# Patient Record
Sex: Male | Born: 2005 | Race: Black or African American | Hispanic: No | Marital: Single | Smoking: Never smoker
Health system: Southern US, Community
[De-identification: ages and names within clinical notes are randomized; demographics above are authoritative.]

## PROBLEM LIST (undated history)

## (undated) HISTORY — PX: FEMUR FRACTURE SURGERY: SHX633

---
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
## 2024-02-20 ENCOUNTER — Ambulatory Visit: Admitting: Pediatrics

## 2024-02-20 DIAGNOSIS — R82998 Other abnormal findings in urine: Secondary | ICD-10-CM | POA: Diagnosis not present

## 2024-02-20 DIAGNOSIS — R079 Chest pain, unspecified: Secondary | ICD-10-CM | POA: Diagnosis not present

## 2024-02-20 DIAGNOSIS — G4489 Other headache syndrome: Secondary | ICD-10-CM | POA: Diagnosis not present

## 2024-02-20 DIAGNOSIS — Z20822 Contact with and (suspected) exposure to covid-19: Secondary | ICD-10-CM | POA: Diagnosis not present

## 2024-02-20 NOTE — Progress Notes (Signed)
 Subjective: Patient ID:  Bradley Andrews is a 18 y.o. male  Chief Complaint  Patient presents with  . Chest Pain    Started Tuesday, sharp pain, does not radiate, denies N/V, shortness of breath  . Headache    Pt c/o headache, body aches. No fever. Symptoms started Tuesday. Pt has been taking Ibuprofen    The following information was reviewed by members of the visit team:  Tobacco  Allergies  Meds  Problems  Med Hx  Surg Hx  OB Status   Fam Hx    18 year old male presents for evaluation of symptoms that started suddenly Tuesday, 2 days ago when he was headed to work.  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  He denies any fall, injury or new activity prior to symptoms starting.  He also denies any new medications, supplements or increased caffeine intake.   He complains of chest pain that is sharp, along with low back pain, what he describes as body aches and a frontal headache.  He denies any fever, chills, nausea, vomiting or change in bowel/bladder habits. He reports he has been eating and drinking normally, and denies any sick contacts or recent travel.   He denies any medical history, and does not appear to be in distress during exam.   Chest Pain  Associated symptoms include back pain and headaches. Pertinent negatives include no abdominal pain, dizziness, fever, nausea, shortness of breath, vomiting or weakness.  Headache  Associated symptoms include back pain. Pertinent negatives include no abdominal pain, dizziness, fever, nausea, vomiting or weakness.     Review of Systems  Constitutional:  Negative for chills and fever.  Respiratory:  Negative for shortness of breath.   Cardiovascular:  Positive for chest pain.  Gastrointestinal:  Negative for abdominal pain, diarrhea, nausea and vomiting.  Genitourinary:  Negative for dysuria.  Musculoskeletal:  Positive for back pain.  Neurological:  Positive for headaches. Negative for dizziness and  weakness.      Objective  Vitals:   02/20/24 1048 02/20/24 1054  BP: 144/83 (!) 130/94  Pulse: 72   Resp: 18   Temp: 97.5 F (36.4 C)   TempSrc: Tympanic   SpO2: 100%   Weight: 96.6 kg (213 lb)   Height: 1.727 m (5' 8)     No LMP for male patient.   Behavioral Health Screening  Patient Health Questionnaire-2 Score: 0 (02/20/2024 10:47 AM)      Patient's Depression screening is Negative   Depression Plan: Normal/Negative Screening  Physical Exam Vitals and nursing note reviewed.  Constitutional:      Appearance: Normal appearance. He is normal weight.  HENT:     Mouth/Throat:     Mouth: Mucous membranes are moist.   Eyes:     Extraocular Movements: Extraocular movements intact.     Conjunctiva/sclera: Conjunctivae normal.     Pupils: Pupils are equal, round, and reactive to light.    Cardiovascular:     Rate and Rhythm: Normal rate and regular rhythm.     Heart sounds: Normal heart sounds.  Pulmonary:     Effort: Pulmonary effort is normal.     Breath sounds: Normal breath sounds.  Chest:     Chest wall: Tenderness present.   Skin:    General: Skin is warm and dry.     Capillary Refill: Capillary refill takes less than 2 seconds.   Neurological:     General: No focal deficit present.  Mental Status: He is alert and oriented to person, place, and time.   Psychiatric:        Mood and Affect: Mood normal.        Behavior: Behavior normal.      Recent Results (from the past 24 hours)  POC Urinalysis Auto without Microscopic   Collection Time: 02/20/24 11:07 AM  Result Value Ref Range   Color, Urine Yellow Yellow   Clarity, Urine Clear Clear   Glucose, Urine Negative Negative mg/dL   Bilirubin, Urine Negative Negative   Ketones, Urine Negative Negative mg/dL   Specific Gravity, Urine 1.020 1.010, 1.015, 1.020, 1.025   Blood, Urine Negative Negative   pH, Urine 6.5 5.0, 5.5, 6.0, 6.5, 7.0, 7.5, 8.0   Protein, Urine Negative Negative mg/dL    Urobilinogen, Urine 0.2 <2.0 mg/dL   Nitrite, Urine Negative Negative   Leukocyte Esterase, Urine Small (A) Negative   Kit/Device Lot # [id number]    Kit/Device Expiration Date 01/19/2025   POC SARS-COV-2 SYMPTOMATIC (IDNOW)   Collection Time: 02/20/24 11:08 AM  Result Value Ref Range   SARS-COV-2 IDNOW Negative Negative   SARS IDNOW Information      A rapid, molecular diagnostic test on the IDNOW.  Negative results should be treated as presumptive and, if inconsistent with clinical symptoms should be confirmed with an alternative molecular assay.     Radiologist interpretation:   XR Chest 1 View  Final Result by Eduard Vikki Guppy, MD (07/31 1203)  XR CHEST 1 VIEW, 02/20/2024 11:23 AM    INDICATION:Chest pain, unspecified \ R07.9 Chest pain, unspecified   COMPARISON: Chest radiograph 12/19/2022.    FINDINGS:     Supportive devices: None  Cardiovascular/lungs/pleura: Cardiac silhouette and pulmonary vasculature   are within normal limits. Lungs are clear. No pleural effusion or   pneumothorax.   Other: Unremarkable.    IMPRESSION:  There is no evidence of acute cardiac or pulmonary abnormality.         EKG: normal sinus rhythm sinus arrhythmia, 68BPM, nonspecific t wave abnormality, similar to past, no acute changes.   Assessment/Plan   Jaynie Shiley was seen today for chest pain and headache.  Diagnoses and all orders for this visit:  Chest pain, unspecified type -     ECG 12 lead -     POC Urinalysis Auto without Microscopic -     POC SARS-COV-2 SYMPTOMATIC (IDNOW) -     CBC with Differential -     Comprehensive Metabolic Panel -     Magnesium -     CK, Total -     Troponin, High Sensitive -     Cancel: XR Chest 2 Views -     XR Chest 1 View  Other headache syndrome -     POC Urinalysis Auto without Microscopic -     POC SARS-COV-2 SYMPTOMATIC (IDNOW) -     CBC with Differential -     Comprehensive Metabolic Panel -     Magnesium -     CK, Total -      ondansetron (ZOFRAN-ODT) 4 mg disintegrating tablet; Dissolve 1 tablet (4 mg total) on tongue every 8 (eight) hours as needed for nausea or vomiting. -     ketorolac  (TORADOL ) injection 15 mg -     dexAMETHasone  (DECADRON ) injection 10 mg  Leukocytes in urine -     Urine Culture    Patient has been instructed on RX/ OTC medications, dosages, side effects, and possible interactions as  associated with each diagnosis in my impression and plan above.  2.   Patient education (verbal/handout) given on diagnosis, pathophysiology, treatment of diagnosis, side effects of medication use for treatment, restrictions while taking medication.  Supportive       Care measures as directed on AVS.  Red Flags associated with diagnosis/es were reviewed and patient instructed on action plan if red flags develop.  3.   Urgent Care Disposition:  Follow up with PCP       They have been instructed that if symptoms worse that should go to Urgent Care, go to the nearest ED, or activate EMS.  4.   Patient agreed with plan and voiced understanding.  NO barriers to adherence perceived by myself.  Electronically signed: Rosaline Jama Collet FNP  Thu 02/20/2024 12:36 PM

## 2024-02-22 DIAGNOSIS — I498 Other specified cardiac arrhythmias: Secondary | ICD-10-CM | POA: Diagnosis not present

## 2024-03-13 ENCOUNTER — Ambulatory Visit

## 2024-03-13 ENCOUNTER — Other Ambulatory Visit (HOSPITAL_COMMUNITY)
Admission: RE | Admit: 2024-03-13 | Discharge: 2024-03-13 | Disposition: A | Discharge status: Home / Self Care | Source: Ambulatory Visit | Attending: Pediatrics | Admitting: Pediatrics

## 2024-03-13 VITALS — BP 124/84 | Ht 68.7 in | Wt 211.1 lb

## 2024-03-13 DIAGNOSIS — Z68.41 Body mass index (BMI) pediatric, greater than or equal to 95th percentile for age: Secondary | ICD-10-CM | POA: Diagnosis not present

## 2024-03-13 DIAGNOSIS — Z113 Encounter for screening for infections with a predominantly sexual mode of transmission: Secondary | ICD-10-CM | POA: Insufficient documentation

## 2024-03-13 DIAGNOSIS — E669 Obesity, unspecified: Secondary | ICD-10-CM

## 2024-03-13 DIAGNOSIS — Z0000 Encounter for general adult medical examination without abnormal findings: Secondary | ICD-10-CM

## 2024-03-13 DIAGNOSIS — Z1331 Encounter for screening for depression: Secondary | ICD-10-CM

## 2024-03-13 DIAGNOSIS — Z1339 Encounter for screening examination for other mental health and behavioral disorders: Secondary | ICD-10-CM | POA: Diagnosis not present

## 2024-03-13 DIAGNOSIS — R040 Epistaxis: Secondary | ICD-10-CM | POA: Diagnosis not present

## 2024-03-13 DIAGNOSIS — Z0001 Encounter for general adult medical examination with abnormal findings: Secondary | ICD-10-CM

## 2024-03-13 DIAGNOSIS — Z114 Encounter for screening for human immunodeficiency virus [HIV]: Secondary | ICD-10-CM

## 2024-03-13 LAB — POCT RAPID HIV: Rapid HIV, POC: NEGATIVE

## 2024-03-13 NOTE — Progress Notes (Signed)
 Adolescent Well Care Visit Bradley Andrews is a 18 y.o. male who is here for well care.     PCP:  Taft Jon PARAS, MD   History was provided by the patient.  Girlfriend present for visit.  Confidentiality was discussed with the patient and, if applicable, with caregiver as well. Patient's personal or confidential phone number: [phone]  Interval history: -Last well-visit 04/12/2022 (18 yo) -discussed increased BMI -04/12/22 - pulm appt, mild intermittent asthma, continue albuterol  PRN -02/20/24 - seen for chest pain and headache, tx zofran, toradol , decadron   Current Issues: Current concerns include:   Epistaxis -frequent nosebleeds -has been ongoing since a kid -worse over the past month -used to use a humidifier but not anymore -sometimes caused by stress -last occurred last weekend, lasted 45 minutes, wasn't applying pressure -last one prior to that was one month ago and was not as long -L nostril more often, but sometimes R nostril -no family history of bleeding disorders -uses NSAIDs for headaches, but only uses them once every 2-3 months -no easy bruising or other cutaneous bleeding -other bleeding such as paper cut will heal pretty quickly -no blood in stool  Nutrition: Nutrition/Eating Behaviors: eats a variety, eats 3 meals per day, likes fruits and vegetables Adequate calcium in diet?: milk, cheese, yogurt Supplements/ Vitamins: none  Exercise/ Media: Play any Sports?:  none Exercise:  active at his job  Sleep:  Sleep: 5-8 hours (sometimes 5 due to work schedule, see below).  No problems falling or staying asleep.  Social Screening: Lives with: mom Parental relations:  good Activities, Work, and Regulatory affairs officer?: work (see below) Concerns regarding behavior with peers?  no Stressors of note: grandma losing her memory  Education: School Name: graduated from [employer] in 2024 Currently works at [employer] in [place]. Works in Pharmacist, hospital. Works 7 pm-7am for 4-5 days.  Patient has a dental home: yes, no concerns   Confidential social history: Tobacco?  no Secondhand smoke exposure?  Mother vapes inside the house. Drugs/ETOH?  Yes, smokes marijuana (once every few weekends)  Sexually Active?  yes   Pregnancy Prevention: uses condoms every time, no history of STI's  Safe at home, in school & in relationships?  Yes Safe to self?  Yes   Screenings:  The patient completed the Rapid Assessment for Adolescent Preventive Services screening questionnaire and the following topics were identified as risk factors and discussed: marijuana use and condom use.  PHQ-9 completed and results indicated no concerns. Flowsheet Row Office Visit from 03/13/2024 in [place] [facility]  PHQ-9 Total Score 1    Physical Exam:  Vitals:   03/13/24 1349  BP: 124/84  Weight: 211 lb 1.6 oz (95.8 kg)  Height: 5' 8.7 (1.745 m)   BP 124/84   Ht 5' 8.7 (1.745 m)   Wt 211 lb 1.6 oz (95.8 kg)   BMI 31.45 kg/m  Body mass index: body mass index is 31.45 kg/m. Blood pressure %iles are not available for patients who are 18 years or older.  Hearing Screening   500Hz  1000Hz  2000Hz  4000Hz   Right ear 20 20 20 20   Left ear 20 20 20 20    Vision Screening   Right eye Left eye Both eyes  Without correction 20/16 20/20 20/16   With correction       General: Awake, alert, appropriately responsive in no acute distress. HEENT: EOMI, PERRL, clear sclera and conjunctiva, corneal light reflex symmetric. TM's clear  bilaterally, non-bulging. Clear nares bilaterally. Oropharynx clear with no tonsillar enlargment or exudates. Moist mucous membranes. Neck: Supple. Lymph Nodes: No palpable lymphadenopathy. CV: RRR, normal S1, S2. No murmur appreciated. 2+ distal pulses.  Pulm: Normal WOB. CTAB with good aeration throughout.  No focal wheezing/crackles. Abd: Normoactive bowel sounds. Soft, non-tender,  non-distended. MSK: Extremities WWP. Moves all extremities equally.  Neuro: Appropriately responsive to stimuli. Normal bulk and tone. No gross deficits appreciated. Skin: No rashes or lesions appreciated. Cap refill < 2 seconds.    Assessment and Plan:   1. 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  Counseled on healthy lifestyle choices with nutrition and physical activity. - Lipid panel  3. Screening for human immunodeficiency virus Results negative. - POCT Rapid HIV  4. Routine screening for STI (sexually transmitted infection) Routine screening.  Results pending. - Urine cytology ancillary only  5. Epistaxis No red flag symptoms.  No easy bruising or other cutaneous bleeding that lasts a long time.  He normally has a nose bleed once per month.  Most recent one lasted 45 minutes, however he was not applying pressure.  Discussed methods to prevent nosebleeds and how to manage them (leaning forward, applying pressure).  Encouraged proper nasal hygiene, such as use of nasal saline sprays and humidifier.  Estefana Leona Spangle, MD [facility]

## 2024-03-13 NOTE — Patient Instructions (Signed)
 Elner CHRISTELLA Chancy it was a pleasure seeing you in clinic today!   Please see the attached documents regarding frequent nosebleeds and how to manage them.  - You can call our clinic with any questions, concerns, or to schedule an appointment at [phone]  Sincerely,  Dr. Estefana Leona Sebastian [facility] [address] [place], [place] [address] [phone]

## 2024-03-16 ENCOUNTER — Ambulatory Visit: Payer: Self-pay | Admitting: Pediatrics

## 2024-03-16 LAB — URINE CYTOLOGY ANCILLARY ONLY
Chlamydia: NEGATIVE
Comment: NEGATIVE
Comment: NORMAL
Neisseria Gonorrhea: NEGATIVE

## 2024-03-18 ENCOUNTER — Telehealth: Payer: Self-pay | Admitting: Pediatrics

## 2024-03-18 LAB — LIPID PANEL
Cholesterol: 273 mg/dL — ABNORMAL HIGH (ref <170)
HDL: 59 mg/dL (ref >45)
LDL Cholesterol (Calc): 190 mg/dL — ABNORMAL HIGH (ref <110)
Non-HDL Cholesterol (Calc): 214 mg/dL — ABNORMAL HIGH (ref <120)
Total CHOL/HDL Ratio: 4.6 (calc) (ref <5.0)
Triglycerides: 117 mg/dL — ABNORMAL HIGH (ref <90)

## 2024-03-18 NOTE — Telephone Encounter (Signed)
 Lipid panel returned abnormal.  Unable to reach patient by MyChart; phone call reached voice mail w/o name or personal voice.  I left message for him to call the office. Re:  Total cholesterol is elevated and triglycerides are higher than desired.  Elevated cholesterol puts him at risk for heart and liver health complications.  I would like to refer him to the nutritionist to discuss a lower fat diet that fits his style of eating (home foods vs restaurant vs snacks).  Please let me know if he is agreeable and I will place referral.    I also advised aerobic exercise like walking, jogging, playing ball, biking, swimming or dancing for at least 30 minutes at least 5 days a week.  This movement is in addition to his normal walking about at work.  He also needs to change his MyChart access to his number.  I would like him to return to repeat labs in Jan/Feb 2026 (approx 6 months).

## 2024-03-20 NOTE — Telephone Encounter (Signed)
 Unable to reach by phone.  Letter placed for mailing.

## 2024-04-21 DIAGNOSIS — J029 Acute pharyngitis, unspecified: Secondary | ICD-10-CM | POA: Diagnosis not present

## 2024-04-21 DIAGNOSIS — Z20822 Contact with and (suspected) exposure to covid-19: Secondary | ICD-10-CM | POA: Diagnosis not present

## 2024-04-21 DIAGNOSIS — J069 Acute upper respiratory infection, unspecified: Secondary | ICD-10-CM | POA: Diagnosis not present
